# Patient Record
Sex: Male | Born: 1970 | Race: White | Hispanic: No | Marital: Married | Smoking: Never smoker
Health system: Southern US, Community
[De-identification: ages and names within clinical notes are randomized; demographics above are authoritative.]

---

## 2016-01-16 ENCOUNTER — Emergency Department
Admission: EM | Admit: 2016-01-16 | Discharge: 2016-01-16 | Disposition: A | Discharge status: Home / Self Care | Payer: BC Managed Care – PPO | Attending: Emergency Medicine | Admitting: Emergency Medicine

## 2016-01-16 ENCOUNTER — Other Ambulatory Visit: Payer: Self-pay

## 2016-01-16 ENCOUNTER — Encounter: Payer: Self-pay | Admitting: Emergency Medicine

## 2016-01-16 ENCOUNTER — Emergency Department: Payer: BC Managed Care – PPO

## 2016-01-16 DIAGNOSIS — R109 Unspecified abdominal pain: Secondary | ICD-10-CM | POA: Diagnosis not present

## 2016-01-16 DIAGNOSIS — A6920 Lyme disease, unspecified: Secondary | ICD-10-CM | POA: Diagnosis not present

## 2016-01-16 DIAGNOSIS — R51 Headache: Secondary | ICD-10-CM | POA: Diagnosis not present

## 2016-01-16 DIAGNOSIS — R519 Headache, unspecified: Secondary | ICD-10-CM

## 2016-01-16 LAB — BASIC METABOLIC PANEL
Anion gap: 6 (ref 5–15)
BUN: 16 mg/dL (ref 6–20)
CALCIUM: 8.7 mg/dL — AB (ref 8.9–10.3)
CO2: 27 mmol/L (ref 22–32)
CREATININE: 1.03 mg/dL (ref 0.61–1.24)
Chloride: 107 mmol/L (ref 101–111)
GFR calc Af Amer: >60 mL/min (ref >60)
GFR calc non Af Amer: >60 mL/min (ref >60)
GLUCOSE: 133 mg/dL — AB (ref 65–99)
Potassium: 4.1 mmol/L (ref 3.5–5.1)
Sodium: 140 mmol/L (ref 135–145)

## 2016-01-16 LAB — TROPONIN I

## 2016-01-16 LAB — CBC WITH DIFFERENTIAL/PLATELET
Basophils Absolute: 0.1 10*3/uL (ref 0–0.1)
Basophils Relative: 1 %
Eosinophils Absolute: 0.1 10*3/uL (ref 0–0.7)
Eosinophils Relative: 2 %
HEMATOCRIT: 41.2 % (ref 40.0–52.0)
Hemoglobin: 13.8 g/dL (ref 13.0–18.0)
LYMPHS ABS: 1.5 10*3/uL (ref 1.0–3.6)
LYMPHS PCT: 24 %
MCH: 28.2 pg (ref 26.0–34.0)
MCHC: 33.5 g/dL (ref 32.0–36.0)
MCV: 84.1 fL (ref 80.0–100.0)
MONO ABS: 0.5 10*3/uL (ref 0.2–1.0)
Monocytes Relative: 7 %
NEUTROS ABS: 4.2 10*3/uL (ref 1.4–6.5)
Neutrophils Relative %: 66 %
Platelets: 203 10*3/uL (ref 150–440)
RBC: 4.9 MIL/uL (ref 4.40–5.90)
RDW: 13.5 % (ref 11.5–14.5)
WBC: 6.3 10*3/uL (ref 3.8–10.6)

## 2016-01-16 LAB — PROTIME-INR
INR: 0.92
Prothrombin Time: 12.6 seconds (ref 11.4–15.0)

## 2016-01-16 MED ORDER — ONDANSETRON HCL 4 MG PO TABS: 4.0000 mg | ORAL_TABLET | Freq: Every day | ORAL | Status: AC | PRN

## 2016-01-16 MED ORDER — DOXYCYCLINE HYCLATE 100 MG PO TABS
100.0000 mg | ORAL_TABLET | Freq: Once | ORAL | Status: AC
  Administered 2016-01-16: 100 mg via ORAL
  Filled 2016-01-16: qty 1

## 2016-01-16 MED ORDER — KETOROLAC TROMETHAMINE 30 MG/ML IJ SOLN
30.0000 mg | Freq: Once | INTRAMUSCULAR | Status: AC
  Administered 2016-01-16: 30 mg via INTRAVENOUS
  Filled 2016-01-16: qty 1

## 2016-01-16 MED ORDER — SODIUM CHLORIDE 0.9 % IV BOLUS (SEPSIS)
1000.0000 mL | Freq: Once | INTRAVENOUS | Status: AC
Start: 2016-01-16 — End: 2016-01-16
  Administered 2016-01-16: 1000 mL via INTRAVENOUS
  Filled 2016-01-16: qty 1000

## 2016-01-16 MED ORDER — PROCHLORPERAZINE EDISYLATE 5 MG/ML IJ SOLN
10.0000 mg | Freq: Once | INTRAMUSCULAR | Status: AC
  Administered 2016-01-16: 10 mg via INTRAVENOUS
  Filled 2016-01-16: qty 2

## 2016-01-16 MED ORDER — DOXYCYCLINE HYCLATE 100 MG PO TABS: 100.0000 mg | ORAL_TABLET | Freq: Two times a day (BID) | ORAL | Status: AC

## 2016-01-16 NOTE — ED Provider Notes (Signed)
[facility] Emergency Department Provider Note   ____________________________________________  Time seen: Approximately 715 AM  I have reviewed the triage vital signs and the nursing notes.   HISTORY  Chief Complaint Headache   HPI Nicholas Mathis is a 45 y.o. male without any chronic medical problems was presented to the emergency department today with a 10 out of 10 headache. He says that he was not feeling well yesterday but did not headache. He says that yesterday he was having joint aches especially to his bilateral hips. He is especially concerned because he had a tick bite about a week ago to his left thigh with a large rash afterward. The rash is large resolved and he says that there is only a small bite mark at the Center with a tick was located. Denies any fever. Says the headache wasn't aching pain to the top of his head. Said that it woke him from sleep at about 5 AM and was a 10 out of 10 and the worst headache of his life. Denies any neck pain. Says that he vomited once and the pain resolved and a 6 out of 10 but is now increasing to an 8 out of 10.Denies any history of cerebral aneurysm in his family. Says he tried take Tylenol for headache earlier this morning but says that he vomited up. Headache is associated with photophobia.  \ History reviewed. No pertinent past medical history.  There are no active problems to display for this patient.   History reviewed. No pertinent past surgical history.  No current outpatient prescriptions on file.  Allergies Review of patient's allergies indicates no known allergies.  History reviewed. No pertinent family history.  Social History Social History  Substance Use Topics  . Smoking status: Never Smoker   . Smokeless tobacco: None  . Alcohol Use: No    Review of Systems Constitutional: No fever/chills Eyes: No visual changes. ENT: No sore throat. Cardiovascular: Denies chest  pain. Respiratory: Denies shortness of breath. Gastrointestinal: No abdominal pain.  No nausea, no vomiting.  No diarrhea.  No constipation. Genitourinary: Negative for dysuria. Musculoskeletal: As above Skin: Negative for rash. Neurological: Negative for focal weakness or numbness.  10-point ROS otherwise negative.  ____________________________________________   PHYSICAL EXAM:  VITAL SIGNS: ED Triage Vitals  Enc Vitals Group     BP 01/16/16 0627 119/79 mmHg     Pulse Rate 01/16/16 0627 63     Resp 01/16/16 0627 18     Temp 01/16/16 0627 98.3 F (36.8 C)     Temp Source 01/16/16 0627 Oral     SpO2 01/16/16 0627 98 %     Weight 01/16/16 0627 185 lb (83.915 kg)     Height 01/16/16 0627 6' (1.829 m)     Head Cir --      Peak Flow --      Pain Score 01/16/16 0628 6     Pain Loc --      Pain Edu? --      Excl. in GC? --     Constitutional: Alert and oriented. Well appearing and in no acute distress. Eyes: Conjunctivae are normal. PERRL. EOMI. Head: Atraumatic. Nose: No congestion/rhinnorhea. Mouth/Throat: Mucous membranes are moist.   Neck: No stridor.  No meningismus. The patient ranges his neck freely without any restriction. Cardiovascular: Normal rate, regular rhythm. Grossly normal heart sounds.   Respiratory: Normal respiratory effort.  No retractions. Lungs CTAB. Gastrointestinal: Soft and nontender. No distention. No abdominal bruits.  No CVA tenderness. Musculoskeletal: No lower extremity tenderness nor edema.  No joint effusions. Neurologic:  Normal speech and language. No gross focal neurologic deficits are appreciated. No gait instability. Skin:  Skin is warm, dry and intact. 1 cm wheel of erythema to the left medial thigh raises the tick and bit him. There is no targetoid lesions. No induration or pus. Psychiatric: Mood and affect are normal. Speech and behavior are normal.  ____________________________________________   LABS (all labs ordered are listed,  but only abnormal results are displayed)  Labs Reviewed  BASIC METABOLIC PANEL - Abnormal; Notable for the following:    Glucose, Bld 133 (*)    Calcium 8.7 (*)    All other components within normal limits  TROPONIN I  CBC WITH DIFFERENTIAL/PLATELET  PROTIME-INR  LYME DISEASE DNA BY PCR(BORRELIA BURG)  ROCKY MTN SPOTTED FVR ABS PNL(IGG+IGM)   ____________________________________________  EKG  ED ECG REPORT I, Arelia Longest, the attending physician, personally viewed and interpreted this ECG.   Date: 01/16/2016  EKG Time: 6:34 AM  Rate: 65  Rhythm: normal sinus rhythm  Axis: Normal  Intervals:none  ST&T Change: No ST segment elevation or depression. No abnormal T-wave inversion.  ____________________________________________  RADIOLOGY   CT Head Wo Contrast (Final result) Result time: 01/16/16 07:22:06   Final result by Rad Results In Interface (01/16/16 07:22:06)   Narrative:   CLINICAL DATA: Headache this morning. Vomiting.  EXAM: CT HEAD WITHOUT CONTRAST  TECHNIQUE: Contiguous axial images were obtained from the base of the skull through the vertex without intravenous contrast.  COMPARISON: None.  FINDINGS: No acute intracranial abnormality. Specifically, no hemorrhage, hydrocephalus, mass lesion, acute infarction, or significant intracranial injury. No acute calvarial abnormality. Visualized paranasal sinuses and mastoids clear. Orbital soft tissues unremarkable.  IMPRESSION: Negative.   Electronically Signed By: Charlett Nose M.D. On: 01/16/2016 07:22    ____________________________________________   PROCEDURES  ____________________________________________   INITIAL IMPRESSION / ASSESSMENT AND PLAN / ED COURSE  Pertinent labs & imaging results that were available during my care of the patient were reviewed by me and considered in my medical decision making (see chart for  details).  ----------------------------------------- 9:08 AM on 01/16/2016 -----------------------------------------  Patient says that the symptoms are resolved at this time. He is no longer having a headache or nausea. He did vomit one more time in the emergency department this was prior to receiving his medications. He continues to be without any distress and has no focal neurological deficits. He had a negative CAT scan within 6 hours the onset of the symptoms. Never had any meningismus. I discussed with him the possibility of an LP and we decided to isolated the risks outweighed the benefits at this time. Given his constellation of symptoms we will start him on doxycycline for presumed Lyme disease. He will be following up with primary care. Does not see a primary care doctor in 6 years. He may follow-up at [facility] where is been seen before by also given the number for the [facility] clinic for follow-up. He is understanding and plans one to comply. Knows to return for any worsening or concerning symptoms. Given these findings is less likely that the patient is having an intracranial hemorrhage. ____________________________________________   FINAL CLINICAL IMPRESSION(S) / ED DIAGNOSES  Headache. Lyme disease.    NEW MEDICATIONS STARTED DURING THIS VISIT:  New Prescriptions   No medications on file     Note:  This document was prepared using Dragon voice recognition software and may  include unintentional dictation errors.    Myrna Blazeravid Matthew Schaevitz, MD 01/16/16 [phone]

## 2016-01-16 NOTE — ED Notes (Addendum)
Pt says he woke this am around 5 with his worst headache ever; pale; diaphoretic; says he vomited once prior to arrival which did ease the pain some-was 10/10 when he woke up; currently 6/10; reports some abd pain this am with hyperactive bowels; thought he was going to have a loose bowel movement but never did;

## 2016-01-16 NOTE — ED Notes (Signed)
Unable to use E-signature.  Pt agrees to discharge and follow-up care.  Verbalizes understanding of discharge instructions

## 2016-01-16 NOTE — ED Notes (Signed)
Patient states that he woke up with the worst headache of his life around 05:00 this am. Patient states that he felt weak and achy in his back and hips. Patient also reports vomiting times one this morning.

## 2016-01-17 LAB — LYME DISEASE DNA BY PCR(BORRELIA BURG): Lyme Disease(B.burgdorferi)PCR: NEGATIVE

## 2016-01-18 LAB — RMSF, IGG, IFA

## 2016-01-18 LAB — ROCKY MTN SPOTTED FVR ABS PNL(IGG+IGM)
RMSF IGM: 0.39 {index} (ref 0.00–0.89)
RMSF IgG: POSITIVE — AB

## 2016-01-19 ENCOUNTER — Telehealth: Payer: Self-pay | Admitting: Emergency Medicine

## 2016-01-19 NOTE — ED Notes (Signed)
Patient returned call. Advised of RMSF positive results. Advised to complete Doxycycline RX and follow up with PCP. Patient stated he has no PCP - advised to call [phone] or to check on website for MD's accepting new patients. Patient also advised to return to ED for any new or continuing symptoms.

## 2016-01-19 NOTE — ED Notes (Signed)
Left message requesting patient to call the ED at [phone] at his earliest convenience.

## 2017-03-31 IMAGING — CT CT HEAD W/O CM
3 series · 16 of 47 positions shown, 19 images · non-contrast
Comparison: None.

CLINICAL DATA: Headache this morning.  Vomiting.

EXAM:
CT HEAD WITHOUT CONTRAST
TECHNIQUE: Contiguous axial images were obtained from the base of the skull
through the vertex without intravenous contrast.

[Series 2: head wo · axial · 0.41mm/px · z∈[-116,+9]mm · 10 of 31 slices shown, 13 images]
[im 3/31  brain]
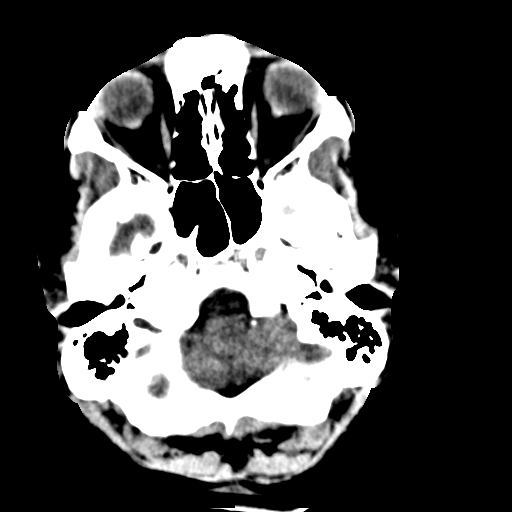
[im 3/31  bone]
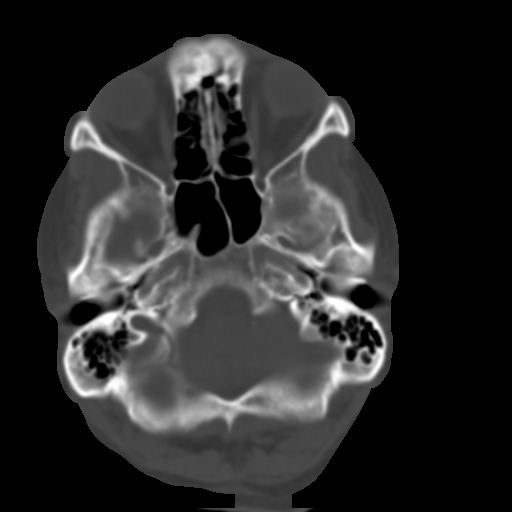
[im 6/31  brain]
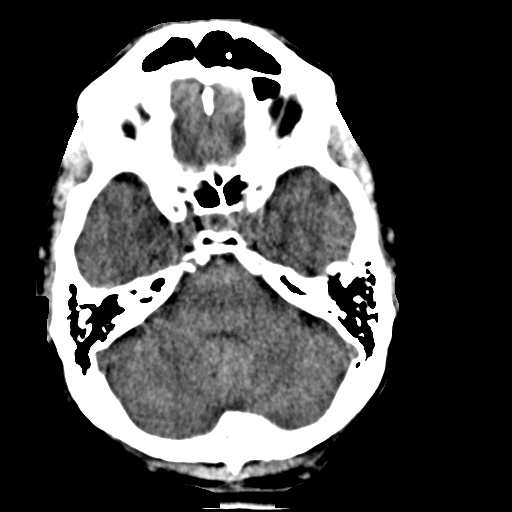
[im 9/31  brain]
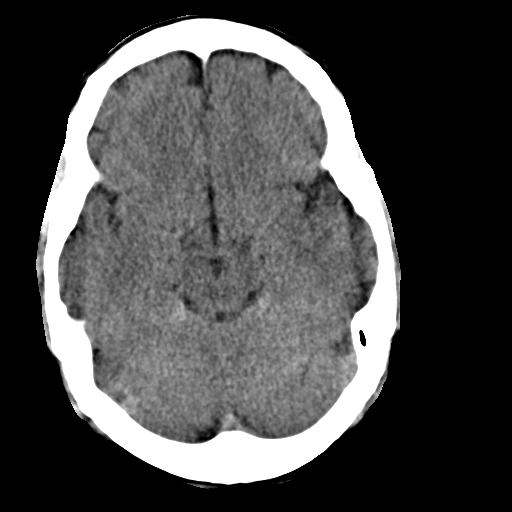
[im 11/31  brain]
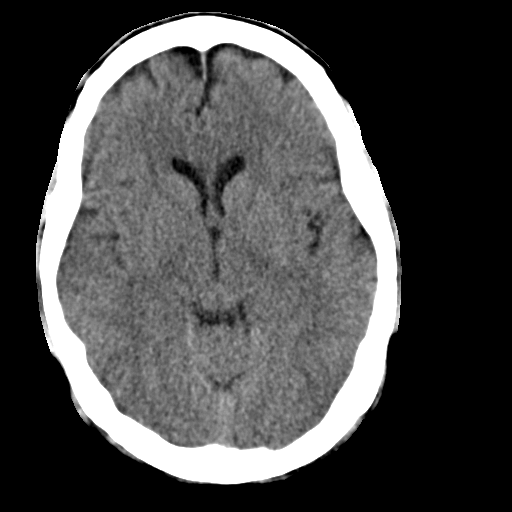
[im 14/31  brain]
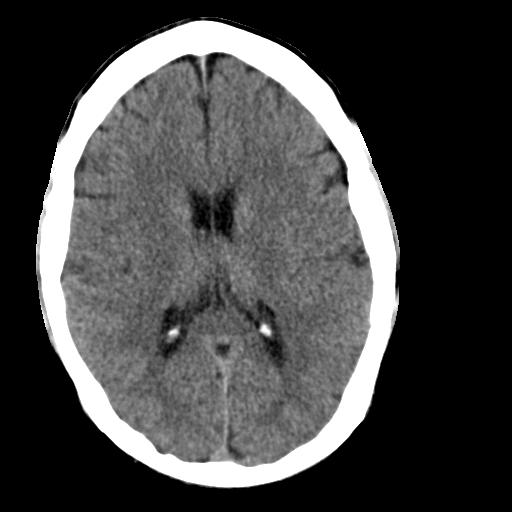
[im 14/31  bone]
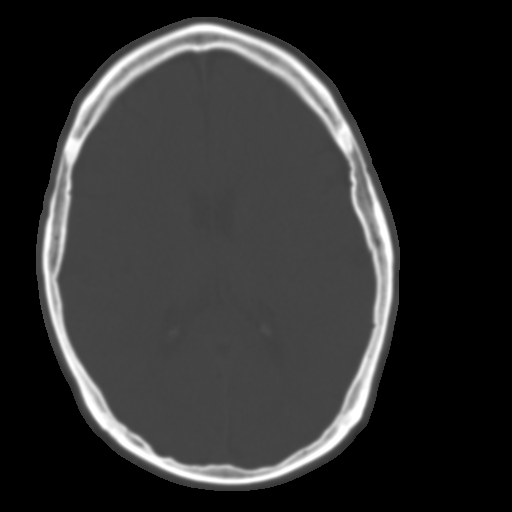
[im 17/31  brain]
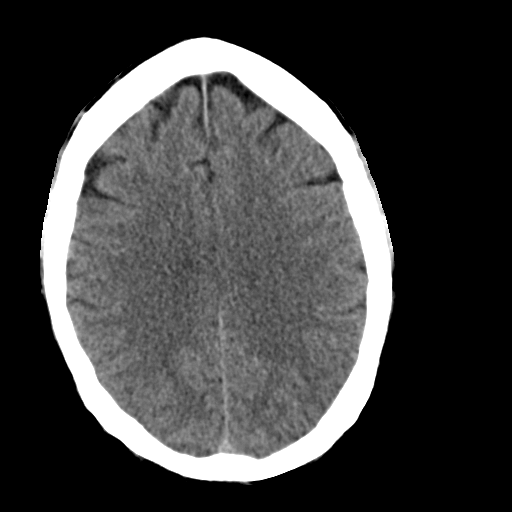
[im 20/31  brain]
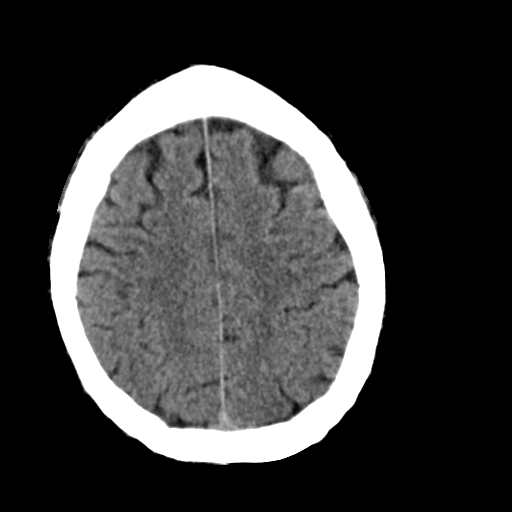
[im 23/31  brain]
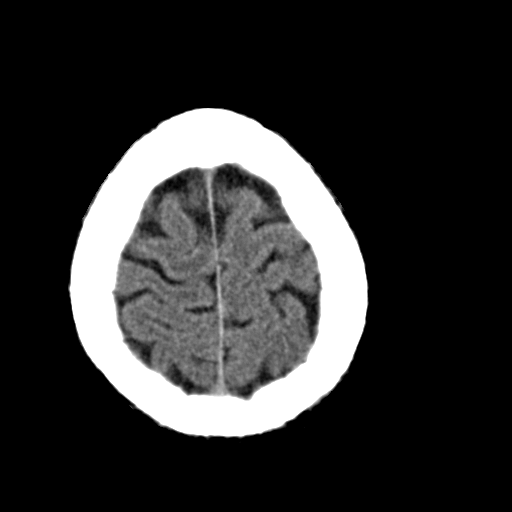
[im 25/31  brain]
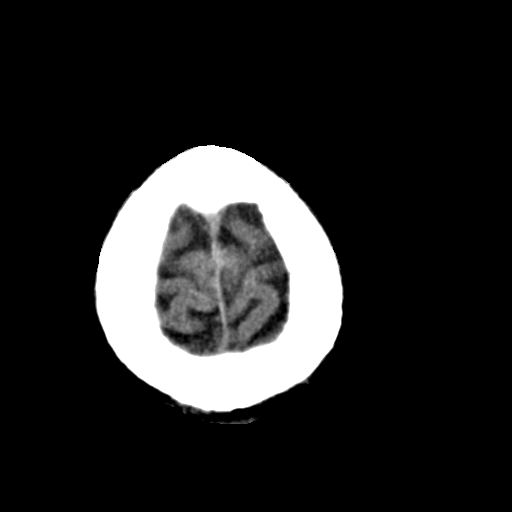
[im 25/31  bone]
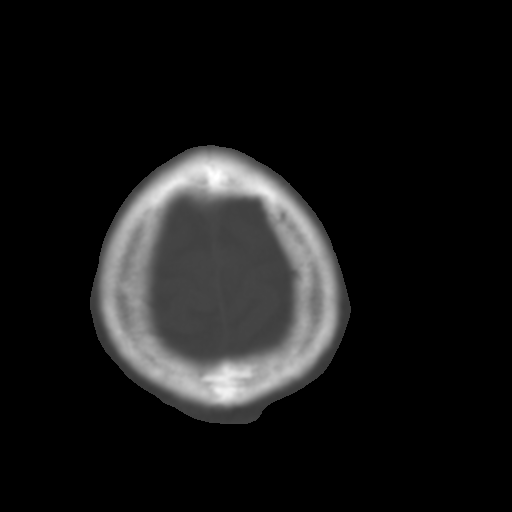
[im 28/31  brain]
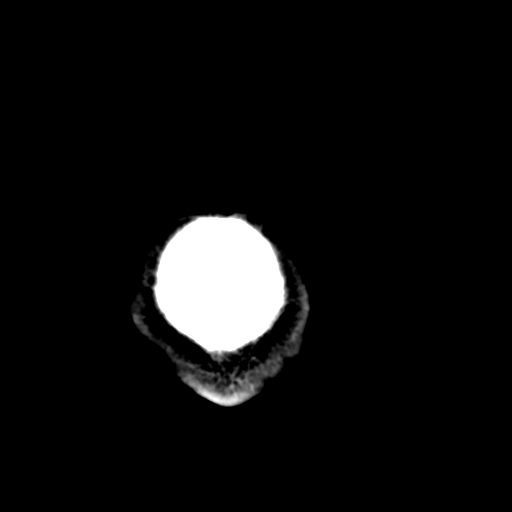

[Series 4: coronal soft · coronal · 0.30mm/px · 3 of 69 slices shown]
[im 23/69  brain]
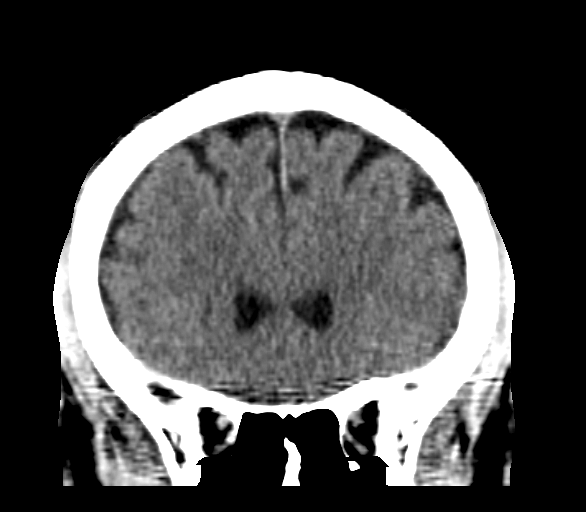
[im 31/69  brain]
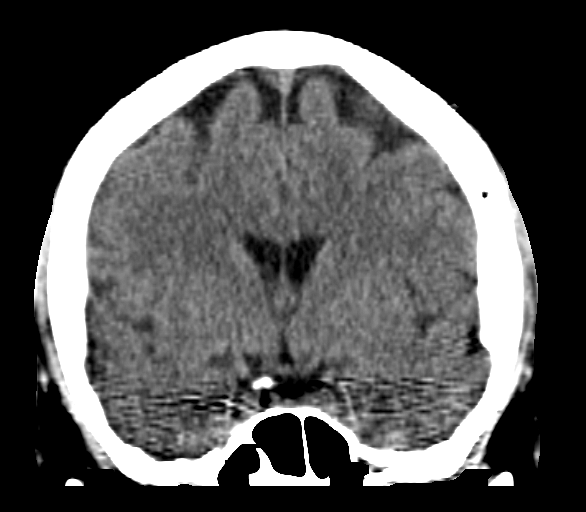
[im 38/69  brain]
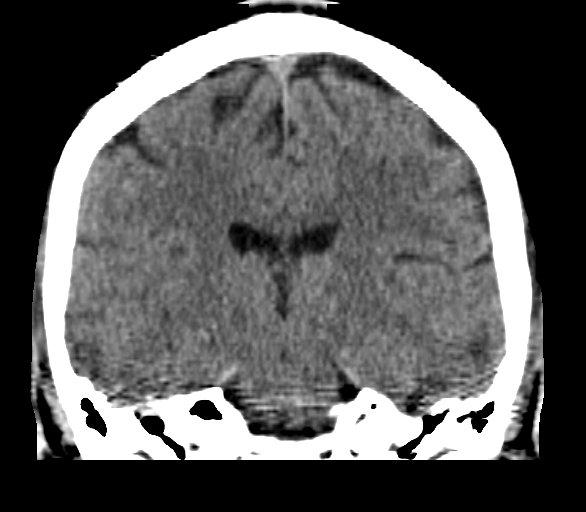

[Series 5: sagittal soft · sagittal · 0.31mm/px · 3 of 54 slices shown]
[im 18/54  brain]
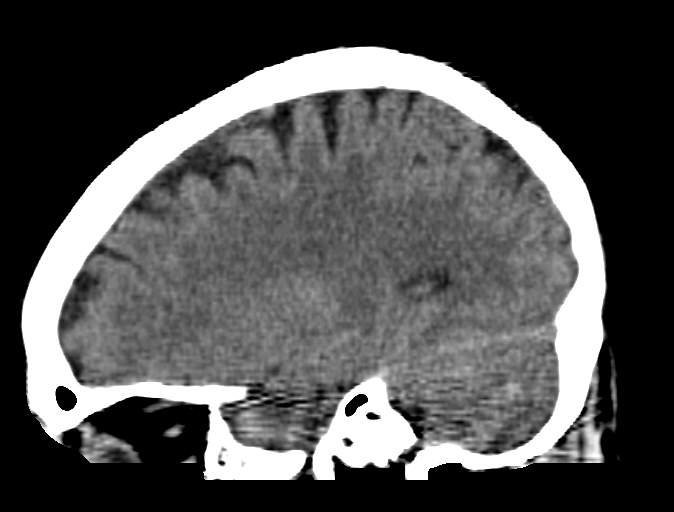
[im 27/54  brain]
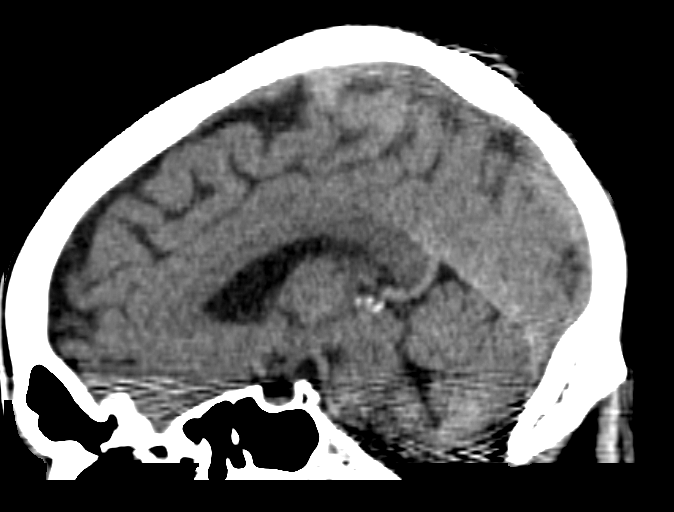
[im 36/54  brain]
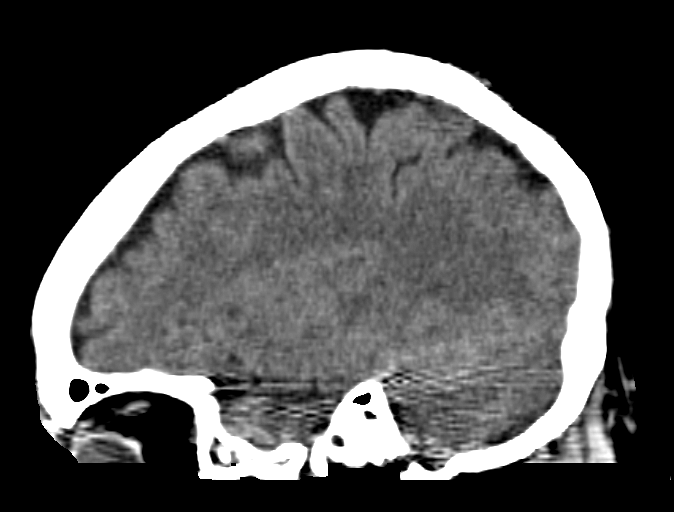

[16 of 47 positions shown; findings below may reference images not displayed]

FINDINGS: No acute intracranial abnormality. Specifically, no hemorrhage,
hydrocephalus, mass lesion, acute infarction, or significant
intracranial injury. No acute calvarial abnormality. Visualized
paranasal sinuses and mastoids clear. Orbital soft tissues
unremarkable.
IMPRESSION: Negative.
